# Patient Record
Sex: Male | Born: 1935 | Race: White | Hispanic: No | Marital: Married
Health system: Southern US, Community
[De-identification: ages and names within clinical notes are randomized; demographics above are authoritative.]

---

## 2012-12-09 ENCOUNTER — Ambulatory Visit: Payer: Self-pay | Admitting: Oncology

## 2012-12-09 LAB — COMPREHENSIVE METABOLIC PANEL
Albumin: 3.7 g/dL (ref 3.4–5.0)
BUN: 31 mg/dL — ABNORMAL HIGH (ref 7–18)
Bilirubin,Total: 0.3 mg/dL (ref 0.2–1.0)
Chloride: 105 mmol/L (ref 98–107)
Co2: 30 mmol/L (ref 21–32)
EGFR (Non-African Amer.): 31 — ABNORMAL LOW
Osmolality: 286 (ref 275–301)
Potassium: 4.1 mmol/L (ref 3.5–5.1)
SGOT(AST): 30 U/L (ref 15–37)
SGPT (ALT): 23 U/L (ref 12–78)
Sodium: 139 mmol/L (ref 136–145)
Total Protein: 7 g/dL (ref 6.4–8.2)

## 2012-12-09 LAB — CBC CANCER CENTER
Eosinophil %: 1.1 %
HCT: 35.1 % — ABNORMAL LOW (ref 40.0–52.0)
Lymphocyte #: 1 x10 3/mm (ref 1.0–3.6)
Lymphocyte %: 14.9 %
MCV: 93 fL (ref 80–100)
Monocyte %: 7.7 %
Neutrophil #: 5.3 x10 3/mm (ref 1.4–6.5)
Neutrophil %: 75.6 %
Platelet: 144 x10 3/mm — ABNORMAL LOW (ref 150–440)
RBC: 3.78 10*6/uL — ABNORMAL LOW (ref 4.40–5.90)
RDW: 13.2 % (ref 11.5–14.5)
WBC: 7 x10 3/mm (ref 3.8–10.6)

## 2012-12-24 ENCOUNTER — Ambulatory Visit: Payer: Self-pay | Admitting: Oncology

## 2013-01-05 ENCOUNTER — Ambulatory Visit: Payer: Self-pay | Admitting: Internal Medicine

## 2013-01-20 LAB — BASIC METABOLIC PANEL
Anion Gap: 8 (ref 7–16)
BUN: 25 mg/dL — ABNORMAL HIGH (ref 7–18)
Calcium, Total: 9 mg/dL (ref 8.5–10.1)
Co2: 31 mmol/L (ref 21–32)
Creatinine: 1.65 mg/dL — ABNORMAL HIGH (ref 0.60–1.30)
EGFR (African American): 44 — ABNORMAL LOW
EGFR (Non-African Amer.): 38 — ABNORMAL LOW
Glucose: 156 mg/dL — ABNORMAL HIGH (ref 65–99)
Potassium: 4 mmol/L (ref 3.5–5.1)
Sodium: 140 mmol/L (ref 136–145)

## 2013-01-21 LAB — PSA: PSA: 8.3 ng/mL

## 2013-01-24 ENCOUNTER — Ambulatory Visit: Payer: Self-pay | Admitting: Oncology

## 2013-03-24 ENCOUNTER — Ambulatory Visit: Payer: Self-pay | Admitting: Oncology

## 2013-03-31 ENCOUNTER — Ambulatory Visit: Payer: Self-pay | Admitting: Urology

## 2013-04-09 ENCOUNTER — Emergency Department: Payer: Self-pay | Admitting: Emergency Medicine

## 2013-04-09 LAB — BASIC METABOLIC PANEL
Anion Gap: 6 — ABNORMAL LOW (ref 7–16)
BUN: 28 mg/dL — ABNORMAL HIGH (ref 7–18)
Calcium, Total: 8.6 mg/dL (ref 8.5–10.1)
Co2: 29 mmol/L (ref 21–32)
Co2: 28 mmol/L (ref 21–32)
Creatinine: 1.66 mg/dL — ABNORMAL HIGH (ref 0.60–1.30)
Creatinine: 1.64 mg/dL — ABNORMAL HIGH (ref 0.60–1.30)
EGFR (African American): 43 — ABNORMAL LOW
EGFR (African American): 44 — ABNORMAL LOW
EGFR (Non-African Amer.): 37 — ABNORMAL LOW
Glucose: 121 mg/dL — ABNORMAL HIGH (ref 65–99)
Glucose: 150 mg/dL — ABNORMAL HIGH (ref 65–99)
Osmolality: 284 (ref 275–301)
Osmolality: 283 (ref 275–301)
Potassium: 4.1 mmol/L (ref 3.5–5.1)
Sodium: 139 mmol/L (ref 136–145)
Sodium: 137 mmol/L (ref 136–145)

## 2013-04-09 LAB — CBC
HCT: 36.4 % — ABNORMAL LOW (ref 40.0–52.0)
HCT: 34.4 % — ABNORMAL LOW (ref 40.0–52.0)
HGB: 12.7 g/dL — ABNORMAL LOW (ref 13.0–18.0)
HGB: 11.7 g/dL — ABNORMAL LOW (ref 13.0–18.0)
MCH: 31.8 pg (ref 26.0–34.0)
MCH: 31 pg (ref 26.0–34.0)
MCHC: 34.8 g/dL (ref 32.0–36.0)
MCV: 91 fL (ref 80–100)
MCV: 91 fL (ref 80–100)
Platelet: 152 10*3/uL (ref 150–440)
RDW: 13.3 % (ref 11.5–14.5)
WBC: 11.3 10*3/uL — ABNORMAL HIGH (ref 3.8–10.6)

## 2013-04-09 LAB — PROTIME-INR
INR: 1.1
Prothrombin Time: 14.1 secs (ref 11.5–14.7)

## 2013-04-09 LAB — APTT: Activated PTT: 29 secs (ref 23.6–35.9)

## 2013-04-10 LAB — BASIC METABOLIC PANEL
Anion Gap: 6 — ABNORMAL LOW (ref 7–16)
Calcium, Total: 7.9 mg/dL — ABNORMAL LOW (ref 8.5–10.1)
Chloride: 103 mmol/L (ref 98–107)
Co2: 26 mmol/L (ref 21–32)
Glucose: 177 mg/dL — ABNORMAL HIGH (ref 65–99)
Osmolality: 283 (ref 275–301)
Potassium: 4.8 mmol/L (ref 3.5–5.1)
Sodium: 135 mmol/L — ABNORMAL LOW (ref 136–145)

## 2013-04-10 LAB — CBC WITH DIFFERENTIAL/PLATELET
Basophil #: 0 10*3/uL (ref 0.0–0.1)
Basophil %: 0.2 %
HCT: 30.8 % — ABNORMAL LOW (ref 40.0–52.0)
Lymphocyte %: 5.9 %
MCH: 31.4 pg (ref 26.0–34.0)
MCHC: 34 g/dL (ref 32.0–36.0)
Monocyte #: 1.3 x10 3/mm — ABNORMAL HIGH (ref 0.2–1.0)
Monocyte %: 9.3 %
Neutrophil #: 11.8 10*3/uL — ABNORMAL HIGH (ref 1.4–6.5)
Neutrophil %: 84.4 %
Platelet: 132 10*3/uL — ABNORMAL LOW (ref 150–440)
RBC: 3.34 10*6/uL — ABNORMAL LOW (ref 4.40–5.90)
WBC: 14 10*3/uL — ABNORMAL HIGH (ref 3.8–10.6)

## 2013-04-11 ENCOUNTER — Ambulatory Visit: Payer: Self-pay | Admitting: Urology

## 2013-04-11 ENCOUNTER — Inpatient Hospital Stay: Payer: Self-pay | Admitting: Urology

## 2013-04-11 LAB — CBC WITH DIFFERENTIAL/PLATELET
Basophil %: 0.3 %
Eosinophil #: 0 10*3/uL (ref 0.0–0.7)
Eosinophil #: 0 10*3/uL (ref 0.0–0.7)
Eosinophil %: 0 %
HCT: 22 % — ABNORMAL LOW (ref 40.0–52.0)
HCT: 27.3 % — ABNORMAL LOW (ref 40.0–52.0)
HGB: 9.5 g/dL — ABNORMAL LOW (ref 13.0–18.0)
MCH: 32 pg (ref 26.0–34.0)
MCHC: 34.5 g/dL (ref 32.0–36.0)
Monocyte #: 0.5 x10 3/mm (ref 0.2–1.0)
Monocyte #: 1 x10 3/mm (ref 0.2–1.0)
Monocyte %: 4.3 %
Monocyte %: 6.9 %
Neutrophil #: 12.7 10*3/uL — ABNORMAL HIGH (ref 1.4–6.5)
Platelet: 98 10*3/uL — ABNORMAL LOW (ref 150–440)
Platelet: 84 10*3/uL — ABNORMAL LOW (ref 150–440)
RBC: 2.37 10*6/uL — ABNORMAL LOW (ref 4.40–5.90)
RBC: 2.99 10*6/uL — ABNORMAL LOW (ref 4.40–5.90)
RDW: 13.5 % (ref 11.5–14.5)
WBC: 12.7 10*3/uL — ABNORMAL HIGH (ref 3.8–10.6)
WBC: 14.3 10*3/uL — ABNORMAL HIGH (ref 3.8–10.6)

## 2013-04-11 LAB — COMPREHENSIVE METABOLIC PANEL
Albumin: 2.5 g/dL — ABNORMAL LOW (ref 3.4–5.0)
Alkaline Phosphatase: 48 U/L — ABNORMAL LOW (ref 50–136)
Anion Gap: 7 (ref 7–16)
Bilirubin,Total: 0.7 mg/dL (ref 0.2–1.0)
Creatinine: 2.99 mg/dL — ABNORMAL HIGH (ref 0.60–1.30)
Glucose: 180 mg/dL — ABNORMAL HIGH (ref 65–99)
Osmolality: 278 (ref 275–301)
SGPT (ALT): 14 U/L (ref 12–78)
Total Protein: 5.2 g/dL — ABNORMAL LOW (ref 6.4–8.2)

## 2013-04-11 LAB — MAGNESIUM: Magnesium: 2.2 mg/dL

## 2013-04-11 LAB — CK TOTAL AND CKMB (NOT AT ARMC)
CK, Total: 221 U/L (ref 35–232)
CK, Total: 237 U/L — ABNORMAL HIGH (ref 35–232)
CK-MB: 3 ng/mL (ref 0.5–3.6)
CK-MB: 3 ng/mL (ref 0.5–3.6)

## 2013-04-11 LAB — PROTIME-INR
INR: 1.2
Prothrombin Time: 15.6 secs — ABNORMAL HIGH (ref 11.5–14.7)

## 2013-04-11 LAB — TROPONIN I: Troponin-I: <0.02 ng/mL

## 2013-04-11 LAB — PHOSPHORUS: Phosphorus: 4.4 mg/dL (ref 2.5–4.9)

## 2013-04-12 LAB — CBC WITH DIFFERENTIAL/PLATELET
Basophil #: 0 10*3/uL (ref 0.0–0.1)
Basophil #: 0 10*3/uL (ref 0.0–0.1)
Basophil %: 0.3 %
Eosinophil #: 0 10*3/uL (ref 0.0–0.7)
Eosinophil #: 0 10*3/uL (ref 0.0–0.7)
Eosinophil %: 0.1 %
Eosinophil %: 0.2 %
HCT: 23 % — ABNORMAL LOW (ref 40.0–52.0)
HCT: 24.7 % — ABNORMAL LOW (ref 40.0–52.0)
Lymphocyte #: 0.5 10*3/uL — ABNORMAL LOW (ref 1.0–3.6)
Lymphocyte %: 4.8 %
Lymphocyte %: 4.3 %
MCH: 32.5 pg (ref 26.0–34.0)
MCHC: 34.1 g/dL (ref 32.0–36.0)
MCV: 91 fL (ref 80–100)
Monocyte #: 0.9 x10 3/mm (ref 0.2–1.0)
Monocyte %: 7.7 %
Neutrophil #: 11.3 10*3/uL — ABNORMAL HIGH (ref 1.4–6.5)
Neutrophil #: 10.6 10*3/uL — ABNORMAL HIGH (ref 1.4–6.5)
Platelet: 80 10*3/uL — ABNORMAL LOW (ref 150–440)
Platelet: 79 10*3/uL — ABNORMAL LOW (ref 150–440)
RBC: 2.81 10*6/uL — ABNORMAL LOW (ref 4.40–5.90)
RDW: 13.8 % (ref 11.5–14.5)
RDW: 16.5 % — ABNORMAL HIGH (ref 11.5–14.5)
WBC: 12.8 10*3/uL — ABNORMAL HIGH (ref 3.8–10.6)

## 2013-04-12 LAB — BASIC METABOLIC PANEL
Anion Gap: 8 (ref 7–16)
Anion Gap: 8 (ref 7–16)
BUN: 59 mg/dL — ABNORMAL HIGH (ref 7–18)
Calcium, Total: 6.8 mg/dL — CL (ref 8.5–10.1)
Chloride: 98 mmol/L (ref 98–107)
Chloride: 99 mmol/L (ref 98–107)
Co2: 21 mmol/L (ref 21–32)
Creatinine: 3.4 mg/dL — ABNORMAL HIGH (ref 0.60–1.30)
EGFR (African American): 18 — ABNORMAL LOW
EGFR (Non-African Amer.): 16 — ABNORMAL LOW
Glucose: 138 mg/dL — ABNORMAL HIGH (ref 65–99)
Glucose: 142 mg/dL — ABNORMAL HIGH (ref 65–99)
Osmolality: 276 (ref 275–301)
Potassium: 5 mmol/L (ref 3.5–5.1)
Sodium: 128 mmol/L — ABNORMAL LOW (ref 136–145)
Sodium: 128 mmol/L — ABNORMAL LOW (ref 136–145)

## 2013-04-13 LAB — CBC WITH DIFFERENTIAL/PLATELET
Basophil #: 0 10*3/uL (ref 0.0–0.1)
Basophil %: 0.2 %
Eosinophil #: 0.1 10*3/uL (ref 0.0–0.7)
HGB: 7.9 g/dL — ABNORMAL LOW (ref 13.0–18.0)
Lymphocyte #: 0.6 10*3/uL — ABNORMAL LOW (ref 1.0–3.6)
Lymphocyte %: 6 %
MCH: 30.8 pg (ref 26.0–34.0)
MCHC: 35 g/dL (ref 32.0–36.0)
MCV: 88 fL (ref 80–100)
Monocyte #: 0.8 x10 3/mm (ref 0.2–1.0)
WBC: 9.8 10*3/uL (ref 3.8–10.6)

## 2013-04-13 LAB — BASIC METABOLIC PANEL
Anion Gap: 8 (ref 7–16)
BUN: 60 mg/dL — ABNORMAL HIGH (ref 7–18)
Calcium, Total: 7.1 mg/dL — ABNORMAL LOW (ref 8.5–10.1)
Chloride: 100 mmol/L (ref 98–107)
Creatinine: 2.68 mg/dL — ABNORMAL HIGH (ref 0.60–1.30)
EGFR (Non-African Amer.): 21 — ABNORMAL LOW
Glucose: 121 mg/dL — ABNORMAL HIGH (ref 65–99)
Osmolality: 279 (ref 275–301)
Potassium: 4.6 mmol/L (ref 3.5–5.1)

## 2013-04-13 LAB — FIBRINOGEN: Fibrinogen: 475 mg/dL — ABNORMAL HIGH (ref 210–470)

## 2013-04-14 LAB — CBC WITH DIFFERENTIAL/PLATELET
Basophil %: 0.3 %
Eosinophil #: 0.2 10*3/uL (ref 0.0–0.7)
Eosinophil %: 2.4 %
HCT: 20.5 % — ABNORMAL LOW (ref 40.0–52.0)
HGB: 7.2 g/dL — ABNORMAL LOW (ref 13.0–18.0)
Lymphocyte #: 0.6 10*3/uL — ABNORMAL LOW (ref 1.0–3.6)
MCH: 30.9 pg (ref 26.0–34.0)
MCHC: 35 g/dL (ref 32.0–36.0)
MCV: 88 fL (ref 80–100)
Monocyte #: 0.6 x10 3/mm (ref 0.2–1.0)
Monocyte %: 8 %
Neutrophil #: 6.2 10*3/uL (ref 1.4–6.5)
RDW: 16.4 % — ABNORMAL HIGH (ref 11.5–14.5)

## 2013-04-14 LAB — PROTIME-INR
INR: 1.2
Prothrombin Time: 15.7 secs — ABNORMAL HIGH (ref 11.5–14.7)

## 2013-04-14 LAB — BASIC METABOLIC PANEL
Anion Gap: 6 — ABNORMAL LOW (ref 7–16)
BUN: 41 mg/dL — ABNORMAL HIGH (ref 7–18)
Calcium, Total: 6.9 mg/dL — CL (ref 8.5–10.1)
Chloride: 103 mmol/L (ref 98–107)
Co2: 26 mmol/L (ref 21–32)
Creatinine: 1.72 mg/dL — ABNORMAL HIGH (ref 0.60–1.30)
EGFR (African American): 41 — ABNORMAL LOW
Glucose: 97 mg/dL (ref 65–99)
Potassium: 3.6 mmol/L (ref 3.5–5.1)
Sodium: 135 mmol/L — ABNORMAL LOW (ref 136–145)

## 2013-04-15 LAB — CBC WITH DIFFERENTIAL/PLATELET
Basophil #: 0 10*3/uL (ref 0.0–0.1)
Basophil %: 0.3 %
Eosinophil #: 0.2 10*3/uL (ref 0.0–0.7)
HCT: 24.6 % — ABNORMAL LOW (ref 40.0–52.0)
HGB: 8.7 g/dL — ABNORMAL LOW (ref 13.0–18.0)
Lymphocyte %: 8.9 %
MCH: 30.9 pg (ref 26.0–34.0)
MCHC: 35.3 g/dL (ref 32.0–36.0)
MCV: 88 fL (ref 80–100)
Neutrophil #: 6.8 10*3/uL — ABNORMAL HIGH (ref 1.4–6.5)
Platelet: 100 10*3/uL — ABNORMAL LOW (ref 150–440)
RBC: 2.8 10*6/uL — ABNORMAL LOW (ref 4.40–5.90)
RDW: 16.2 % — ABNORMAL HIGH (ref 11.5–14.5)
WBC: 8.5 10*3/uL (ref 3.8–10.6)

## 2013-04-15 LAB — URINALYSIS, COMPLETE
RBC,UR: 783 /HPF (ref 0–5)
Specific Gravity: 1.016 (ref 1.003–1.030)
WBC UR: 101 /HPF (ref 0–5)

## 2013-04-15 LAB — OCCULT BLOOD X 1 CARD TO LAB, STOOL: Occult Blood, Feces: POSITIVE

## 2013-04-15 LAB — GASTROCCULT (ARMC)
Occult Blood, Gastric: POSITIVE
Ph, Gastric: 1 (ref 1–3)

## 2013-04-16 LAB — BASIC METABOLIC PANEL
BUN: 29 mg/dL — ABNORMAL HIGH (ref 7–18)
Calcium, Total: 6.9 mg/dL — CL (ref 8.5–10.1)
Chloride: 104 mmol/L (ref 98–107)
EGFR (African American): 43 — ABNORMAL LOW
EGFR (Non-African Amer.): 37 — ABNORMAL LOW
Glucose: 122 mg/dL — ABNORMAL HIGH (ref 65–99)
Sodium: 137 mmol/L (ref 136–145)

## 2013-04-16 LAB — CBC WITH DIFFERENTIAL/PLATELET
Basophil #: 0 10*3/uL (ref 0.0–0.1)
Basophil %: 0.4 %
Eosinophil #: 0.2 10*3/uL (ref 0.0–0.7)
Eosinophil %: 1.6 %
HGB: 8.8 g/dL — ABNORMAL LOW (ref 13.0–18.0)
Lymphocyte #: 0.6 10*3/uL — ABNORMAL LOW (ref 1.0–3.6)
MCH: 30.5 pg (ref 26.0–34.0)
MCV: 88 fL (ref 80–100)
Neutrophil #: 9.6 10*3/uL — ABNORMAL HIGH (ref 1.4–6.5)
Platelet: 123 10*3/uL — ABNORMAL LOW (ref 150–440)
RBC: 2.89 10*6/uL — ABNORMAL LOW (ref 4.40–5.90)
RDW: 15.8 % — ABNORMAL HIGH (ref 11.5–14.5)
WBC: 11.3 10*3/uL — ABNORMAL HIGH (ref 3.8–10.6)

## 2013-04-17 ENCOUNTER — Ambulatory Visit: Payer: Self-pay | Admitting: Oncology

## 2013-04-17 LAB — CBC WITH DIFFERENTIAL/PLATELET
Basophil #: 0 10*3/uL (ref 0.0–0.1)
HCT: 22.7 % — ABNORMAL LOW (ref 40.0–52.0)
HGB: 7.7 g/dL — ABNORMAL LOW (ref 13.0–18.0)
Lymphocyte #: 0.8 10*3/uL — ABNORMAL LOW (ref 1.0–3.6)
Lymphocyte %: 11.3 %
MCH: 29.9 pg (ref 26.0–34.0)
MCV: 88 fL (ref 80–100)
Monocyte #: 0.5 x10 3/mm (ref 0.2–1.0)
Monocyte %: 7.2 %
Neutrophil #: 5.5 10*3/uL (ref 1.4–6.5)
Neutrophil %: 77.3 %
RBC: 2.57 10*6/uL — ABNORMAL LOW (ref 4.40–5.90)
RDW: 15.3 % — ABNORMAL HIGH (ref 11.5–14.5)
WBC: 7.1 10*3/uL (ref 3.8–10.6)

## 2013-04-17 LAB — BASIC METABOLIC PANEL
Anion Gap: 4 — ABNORMAL LOW (ref 7–16)
BUN: 25 mg/dL — ABNORMAL HIGH (ref 7–18)
Calcium, Total: 6.7 mg/dL — CL (ref 8.5–10.1)
Chloride: 109 mmol/L — ABNORMAL HIGH (ref 98–107)
Co2: 28 mmol/L (ref 21–32)
Creatinine: 1.47 mg/dL — ABNORMAL HIGH (ref 0.60–1.30)
EGFR (Non-African Amer.): 43 — ABNORMAL LOW
Glucose: 94 mg/dL (ref 65–99)
Osmolality: 285 (ref 275–301)
Potassium: 3.5 mmol/L (ref 3.5–5.1)

## 2013-04-17 LAB — MAGNESIUM: Magnesium: 1.7 mg/dL — ABNORMAL LOW

## 2013-04-19 LAB — CULTURE, BLOOD (SINGLE)

## 2013-04-23 ENCOUNTER — Ambulatory Visit: Payer: Self-pay | Admitting: Oncology

## 2013-05-04 ENCOUNTER — Ambulatory Visit: Payer: Self-pay | Admitting: Oncology

## 2013-05-05 LAB — BASIC METABOLIC PANEL
BUN: 17 mg/dL (ref 7–18)
Calcium, Total: 9 mg/dL (ref 8.5–10.1)
Co2: 27 mmol/L (ref 21–32)
EGFR (African American): 49 — ABNORMAL LOW
EGFR (Non-African Amer.): 42 — ABNORMAL LOW
Glucose: 124 mg/dL — ABNORMAL HIGH (ref 65–99)
Osmolality: 286 (ref 275–301)
Potassium: 4.4 mmol/L (ref 3.5–5.1)
Sodium: 142 mmol/L (ref 136–145)

## 2013-05-05 LAB — CBC CANCER CENTER
Basophil #: 0.1 x10 3/mm (ref 0.0–0.1)
Eosinophil #: 0.1 x10 3/mm (ref 0.0–0.7)
HCT: 28.4 % — ABNORMAL LOW (ref 40.0–52.0)
Lymphocyte #: 0.9 x10 3/mm — ABNORMAL LOW (ref 1.0–3.6)
MCH: 29.2 pg (ref 26.0–34.0)
MCHC: 33.1 g/dL (ref 32.0–36.0)
MCV: 88 fL (ref 80–100)
Monocyte #: 0.4 x10 3/mm (ref 0.2–1.0)
Neutrophil #: 6.8 x10 3/mm — ABNORMAL HIGH (ref 1.4–6.5)
Neutrophil %: 81.5 %
Platelet: 192 x10 3/mm (ref 150–440)
RDW: 15.1 % — ABNORMAL HIGH (ref 11.5–14.5)
WBC: 8.4 x10 3/mm (ref 3.8–10.6)

## 2013-05-06 LAB — PSA: PSA: 9.9 ng/mL — ABNORMAL HIGH (ref 0.0–4.0)

## 2013-05-24 ENCOUNTER — Ambulatory Visit: Payer: Self-pay | Admitting: Oncology

## 2013-06-05 LAB — BASIC METABOLIC PANEL
Anion Gap: 6 — ABNORMAL LOW (ref 7–16)
BUN: 24 mg/dL — ABNORMAL HIGH (ref 7–18)
Co2: 32 mmol/L (ref 21–32)
EGFR (African American): 37 — ABNORMAL LOW
Potassium: 4.1 mmol/L (ref 3.5–5.1)

## 2013-06-06 LAB — PSA: PSA: 5.2 ng/mL — ABNORMAL HIGH (ref 0.0–4.0)

## 2013-06-23 ENCOUNTER — Ambulatory Visit: Payer: Self-pay | Admitting: Oncology

## 2013-07-03 LAB — BASIC METABOLIC PANEL
Anion Gap: 7 (ref 7–16)
Calcium, Total: 8.2 mg/dL — ABNORMAL LOW (ref 8.5–10.1)
Chloride: 107 mmol/L (ref 98–107)
Creatinine: 1.89 mg/dL — ABNORMAL HIGH (ref 0.60–1.30)
EGFR (Non-African Amer.): 32 — ABNORMAL LOW
Glucose: 138 mg/dL — ABNORMAL HIGH (ref 65–99)
Osmolality: 295 (ref 275–301)

## 2013-07-24 ENCOUNTER — Ambulatory Visit: Payer: Self-pay | Admitting: Oncology

## 2013-07-31 LAB — BASIC METABOLIC PANEL
Calcium, Total: 8.5 mg/dL (ref 8.5–10.1)
Chloride: 105 mmol/L (ref 98–107)
Co2: 31 mmol/L (ref 21–32)
EGFR (Non-African Amer.): 27 — ABNORMAL LOW
Glucose: 156 mg/dL — ABNORMAL HIGH (ref 65–99)
Osmolality: 294 (ref 275–301)
Potassium: 4.3 mmol/L (ref 3.5–5.1)
Sodium: 142 mmol/L (ref 136–145)

## 2013-08-24 ENCOUNTER — Ambulatory Visit: Payer: Self-pay | Admitting: Oncology

## 2013-08-28 ENCOUNTER — Ambulatory Visit: Payer: Self-pay | Admitting: Oncology

## 2013-08-28 LAB — BASIC METABOLIC PANEL
Anion Gap: 6 — ABNORMAL LOW (ref 7–16)
BUN: 30 mg/dL — ABNORMAL HIGH (ref 7–18)
Calcium, Total: 8.9 mg/dL (ref 8.5–10.1)
Chloride: 107 mmol/L (ref 98–107)
Co2: 31 mmol/L (ref 21–32)
Creatinine: 1.91 mg/dL — ABNORMAL HIGH (ref 0.60–1.30)
EGFR (Non-African Amer.): 31 — ABNORMAL LOW
Glucose: 134 mg/dL — ABNORMAL HIGH (ref 65–99)
Potassium: 3.6 mmol/L (ref 3.5–5.1)
Sodium: 144 mmol/L (ref 136–145)

## 2013-09-18 ENCOUNTER — Ambulatory Visit: Payer: Self-pay | Admitting: Urology

## 2013-09-18 LAB — PROTIME-INR: INR: 1

## 2013-09-18 LAB — APTT: Activated PTT: 30.3 secs (ref 23.6–35.9)

## 2013-09-23 ENCOUNTER — Ambulatory Visit: Payer: Self-pay | Admitting: Oncology

## 2013-10-12 ENCOUNTER — Ambulatory Visit: Payer: Self-pay | Admitting: Urology

## 2013-10-12 DIAGNOSIS — I10 Essential (primary) hypertension: Secondary | ICD-10-CM

## 2013-10-12 LAB — CBC WITH DIFFERENTIAL/PLATELET
Basophil #: 0.1 10*3/uL (ref 0.0–0.1)
Basophil %: 0.7 %
Eosinophil %: 2.3 %
Lymphocyte #: 0.6 10*3/uL — ABNORMAL LOW (ref 1.0–3.6)
Monocyte #: 0.4 x10 3/mm (ref 0.2–1.0)
Monocyte %: 4.9 %
Neutrophil #: 7.2 10*3/uL — ABNORMAL HIGH (ref 1.4–6.5)
Neutrophil %: 85.3 %
Platelet: 170 10*3/uL (ref 150–440)
RBC: 3.24 10*6/uL — ABNORMAL LOW (ref 4.40–5.90)
RDW: 14.3 % (ref 11.5–14.5)

## 2013-10-12 LAB — BASIC METABOLIC PANEL
Anion Gap: 3 — ABNORMAL LOW (ref 7–16)
Calcium, Total: 8.7 mg/dL (ref 8.5–10.1)
Chloride: 107 mmol/L (ref 98–107)
Co2: 29 mmol/L (ref 21–32)
Creatinine: 1.91 mg/dL — ABNORMAL HIGH (ref 0.60–1.30)
EGFR (African American): 36 — ABNORMAL LOW
Potassium: 4.6 mmol/L (ref 3.5–5.1)

## 2013-10-19 ENCOUNTER — Ambulatory Visit: Payer: Self-pay | Admitting: Urology

## 2013-10-19 LAB — PLATELET COUNT: Platelet: 179 10*3/uL (ref 150–440)

## 2013-10-19 LAB — PROTIME-INR
INR: 1
Prothrombin Time: 13 secs (ref 11.5–14.7)

## 2013-10-19 LAB — APTT: Activated PTT: 29 secs (ref 23.6–35.9)

## 2013-11-10 ENCOUNTER — Ambulatory Visit: Payer: Self-pay | Admitting: Urology

## 2014-03-24 IMAGING — XA IR URETERAL STENT INTRO ANTEGRADE
8 series · 8 of 8 positions shown · non-contrast
Comparison: none

REASON FOR EXAM: Left Hydronephrosis
COMMENTS:

[Series 1: single · 1 of 1 slices shown (1 of 8)]
[im 1/1]
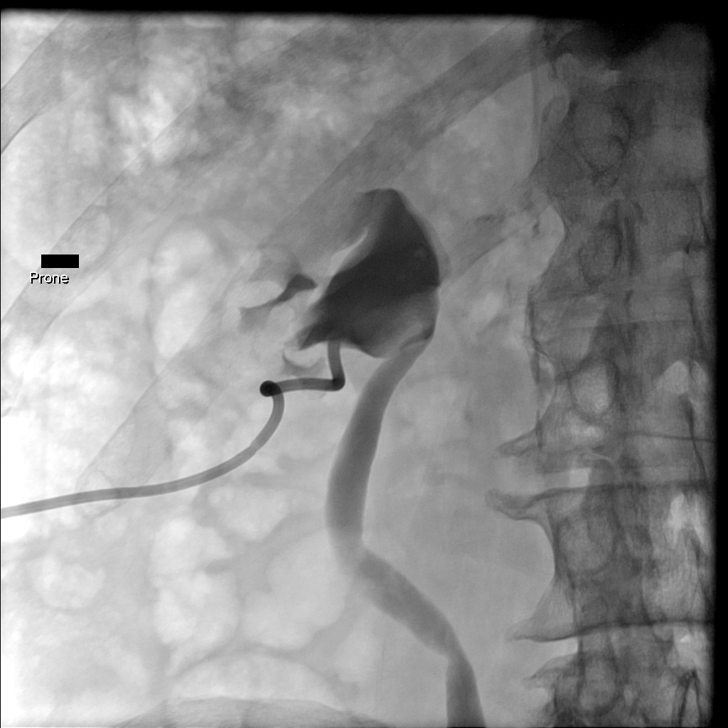

[Series 2: single · 1 of 1 slices shown (2 of 8)]
[im 1/1]
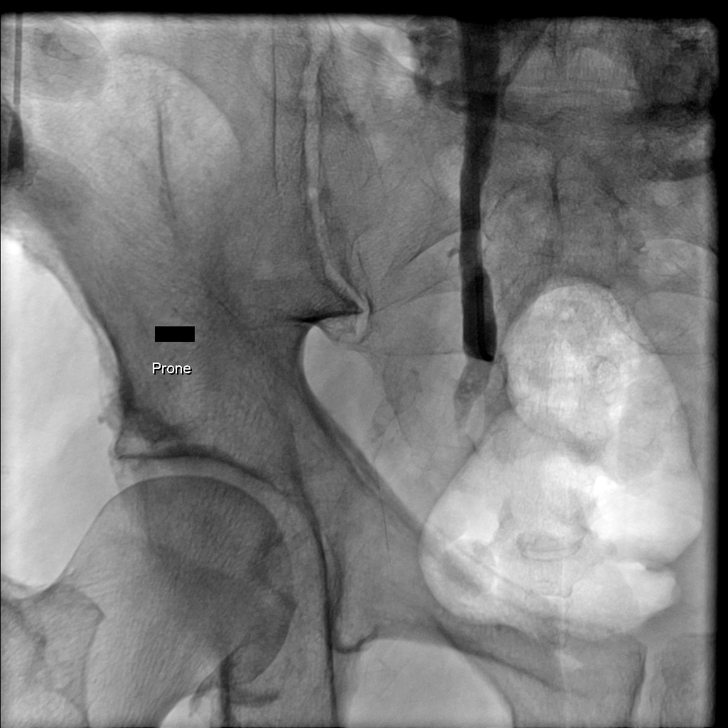

[Series 3: single · 1 of 1 slices shown (3 of 8)]
[im 1/1]
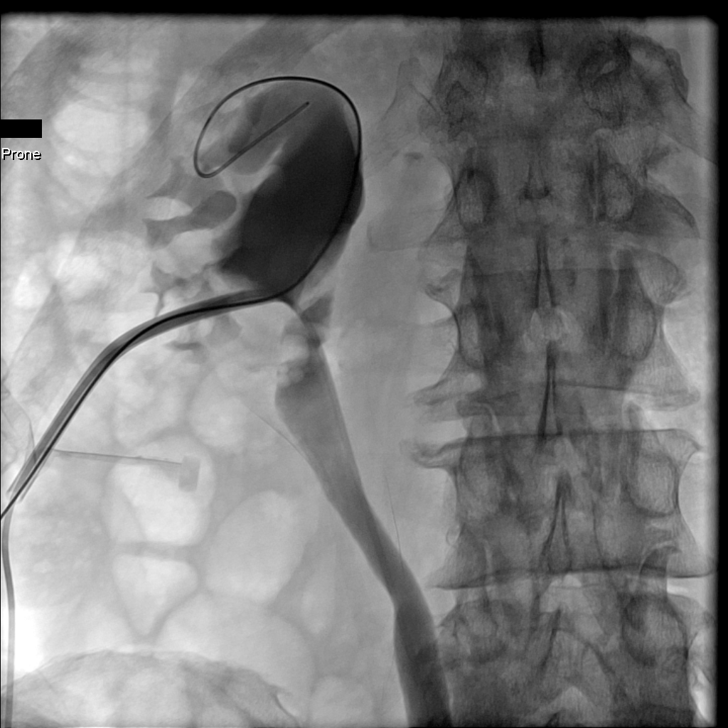

[Series 4: single · 1 of 1 slices shown (4 of 8)]
[im 1/1]
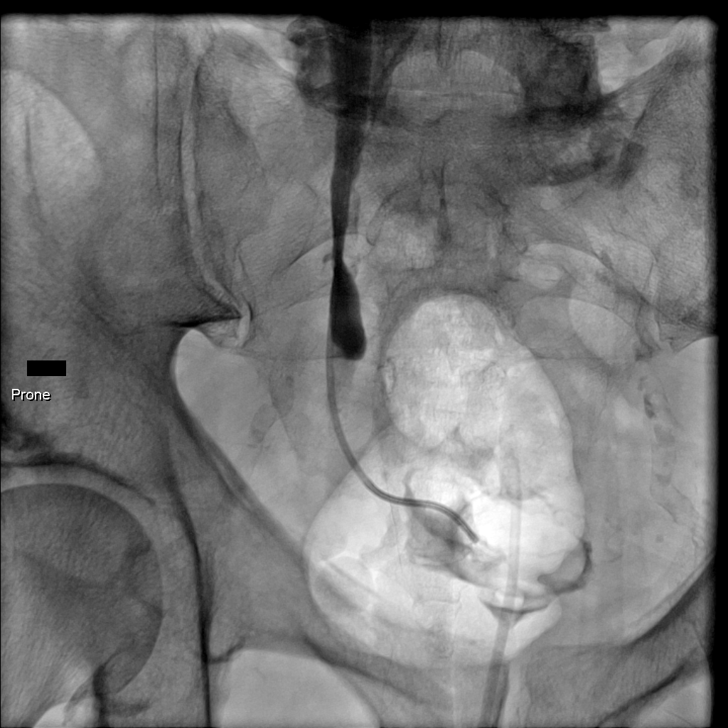

[Series 5: single · 1 of 1 slices shown (5 of 8)]
[im 1/1]
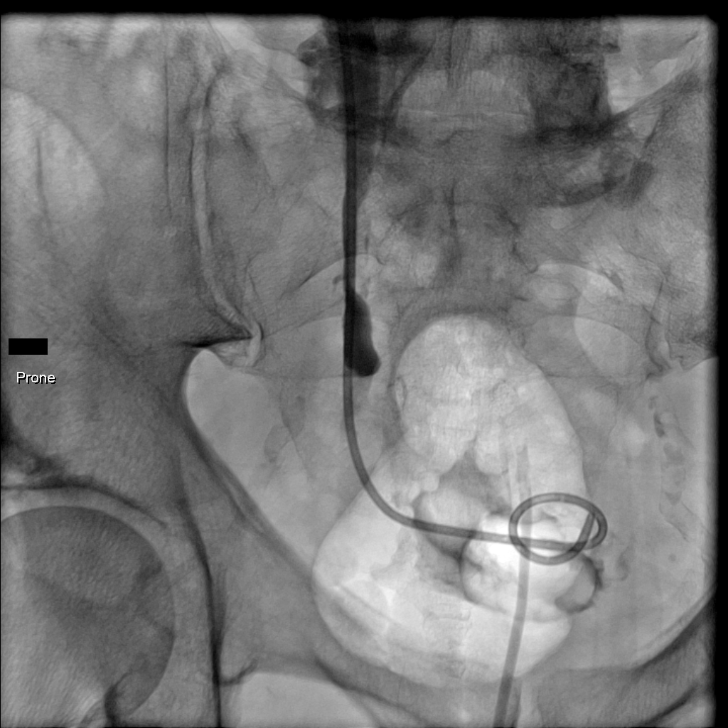

[Series 6: single · 1 of 1 slices shown (6 of 8)]
[im 1/1]
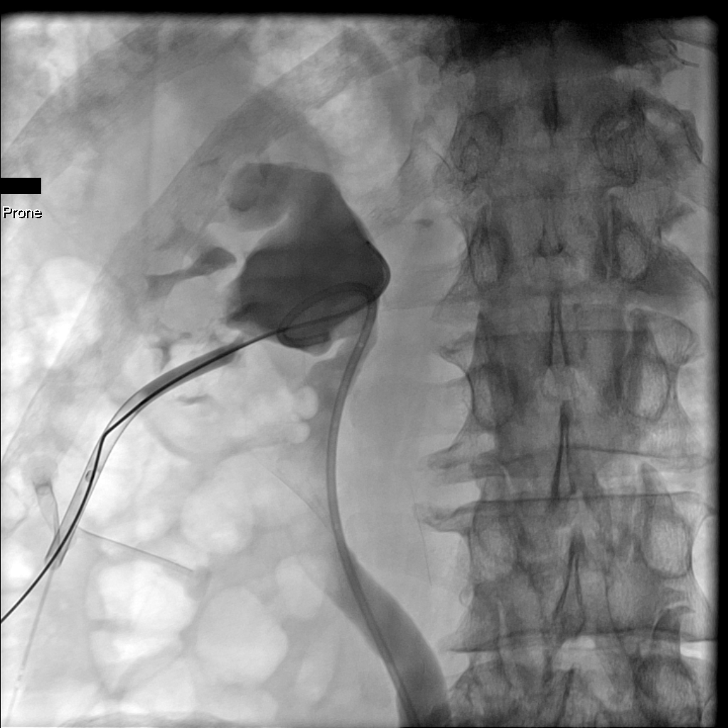

[Series 7: single · 1 of 1 slices shown (7 of 8)]
[im 1/1]
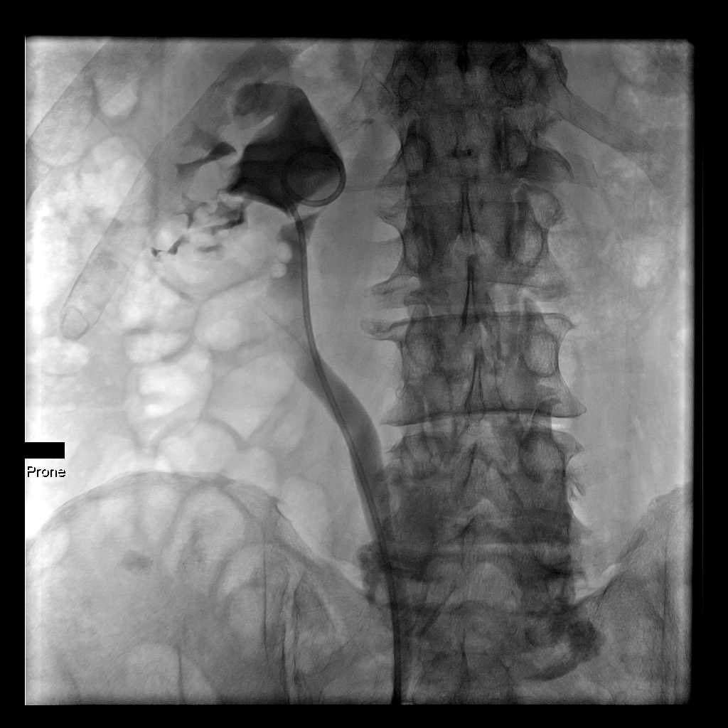

[Series 8: single · 1 of 1 slices shown (8 of 8)]
[im 1/1]
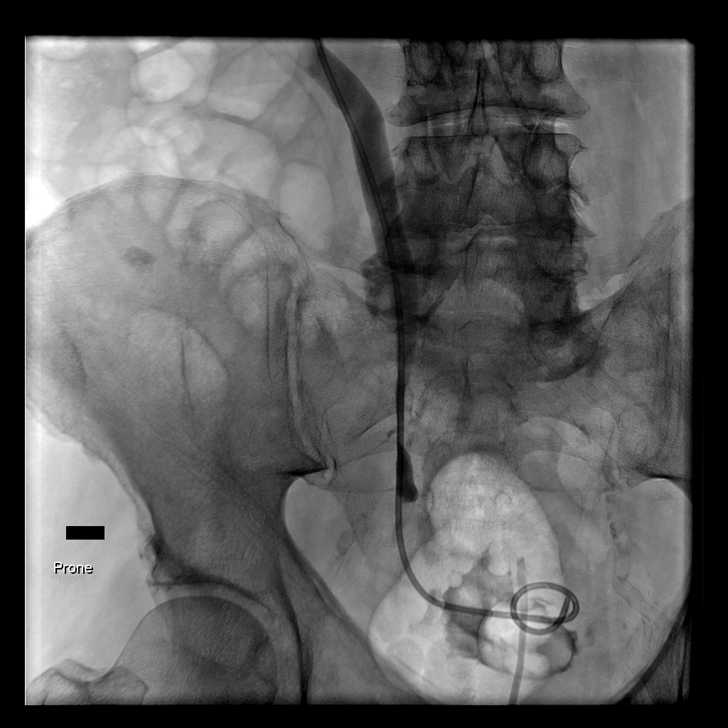

[8 of 8 positions shown; findings below may reference images not displayed]

PROCEDURE:     VAS - STENT PLACE LEFT URETER  - October 19, 2013  [DATE]

RESULT:     History: Hydronephrosis.

Procedure and Findings: After discussing the risk and benefits of this
procedure with the patient informed consent was obtained. The back was
sterilely prepped and draped. IV conscious sedation was performed as
documented. Local anesthesia was administered with 1% lidocaine. Left
nephrostogram was performed. Nephrostomy tube was removed over an Amplatz
extra-stiff wire and a 10 French peel-away sheath placed. This was followed
by placement of a Kumpe catheter and Glidewire. Glidewire and catheter were
advanced down the left ureter across the distal left ureteral obstruction
and into the bladder. Guidewire exchange for Lunderquist exchange wire was
performed. This followed placement of a 6 French 24 cm double-J ureteral
stent. No complications.
IMPRESSION: Successful left double-J stent placement placement.

## 2014-06-23 DEATH — deceased

## 2015-04-15 NOTE — Discharge Summary (Signed)
PATIENT NAME:  John Espinoza, John Espinoza MR#:  [id number] DATE OF BIRTH:  28-May-1926  DATE OF ADMISSION:  04/11/2013 DATE OF DISCHARGE:  04/17/2013   PRINCIPAL DIAGNOSES: Metastatic prostate cancer. Bladder hemorrhage with clot retention. Chronic left hydronephrosis. New-onset right hydronephrosis secondary to clot retention. Acute on chronic renal failure. Transient ischemic attack. Gastrointestinal bleed.   PROCEDURES: None.   INDICATIONS: The patient is an 81 year old gentleman with a long history of metastatic prostate cancer. He has known chronic left-sided hydronephrosis related to tumor ingrowth into the base of the bladder. He has had episodes of bleeding ongoing for several months. He has had a previous episode of bleed in the past. The ureteral orifices are not identifiable in the bladder based on previous cystoscopy due to tumor ingrowth into the base of the bladder. He presented to the Emergency Room on the 17th with acute bleeding. He returned to the Emergency Room on the 19th with significant bleeding and clot retention, requiring admission for continuous bladder irrigation. He was admitted for this purpose.   HOSPITAL COURSE: The patient was admitted on 04/11/2013 for clot retention related to bladder hemorrhage from metastatic prostate cancer with bladder ingrowth. He was placed on continuous bladder irrigation with a 22-French Foley catheter. The bleeding persisted due to his use of aspirin prior to presentation. The bleeding was able to be initially controlled and irrigated from the catheter; however, he did develop numerous episodes of recurrent clot retention despite the continuous bladder irrigation. Due to concerns over hydronephrosis and the location of his ureteral orifice, the decision was made not to proceed with any attempt at fulguration initially. He was given Amicar to help with blood clotting due to his previous aspirin use. The dose was limited due to his previous history of stroke and  concerns over recurrent stroke. The catheter was subsequently changed to a 24-French Couvelaire catheter on continuous bladder irrigation. This allowed better evacuation of clots. This continued for several days with gradual improvement in the bleeding as the aspirin effects decreased. He developed acute on chronic renal failure with his serum creatinine reaching 3.4 on April 20. His baseline serum creatinine prior to presentation was 1.6. He had decreased to 1.47 by the time of discharge. He was noted to have new-onset right-sided hydronephrosis, in addition to the known chronic left hydronephrosis. This was a factor in the acute renal failure. This was felt to be related to his clot retention and obstruction of the right ureter related to clot. He did pass a long thin clot consistent with blood in the ureter with subsequent improvement in his renal function. An attempt at catheter removal and voiding trial was unsuccessful due to residual clot within the bladder. This was irrigated free once the catheter was replaced. An additional attempt at catheter removal and voiding trial was made on April 25. He had a bladder volume of just over 300 mL. He had no urge to urinate. A catheter was placed with only dark urine returned with no evidence of active bleeding or clot. The decision was made to remove the Foley catheter and discharge to skilled nursing facility. They have the ability to place a catheter if he is not able to void within 8 to 12 hours. His hemoglobin dropped precipitously due to the initial bleeding. His presentation hemoglobin was 12.7. He dropped to 7.6 on April 19. He received 4 units of blood transfusion with subsequent stabilization of his hemoglobin. His final hemoglobin status was 7.7 with no recurrent active bleeding. He was  found to have guaiac-positive stool and NG suction consistent with probable GI bleed, although it was felt based on GI evaluation that this was minimal in regards to his  overall blood loss. He developed abdominal distention and nausea on his initial days of hospitalization and x-ray suggested possible ileus. An NG tube was placed with subsequent decompression. This was removed after several days with advancing his diet to general, which was tolerated without significant difficulty. He underwent evaluation from a food intake and speech standpoint. He also had an episode of altered mental status that was evaluated by the hospitalist. A CT scan of the brain demonstrated no acute stroke; however, he does have a history of stroke. It was felt that he probably had a TIA. He did have some initial facial drooping and slurring, which resolved spontaneously. He was very weak due to the blood loss and multiple medical issues. He essentially stabilized and was transferred on 04/17/2013 to skilled nursing facility. A followup will be arranged in approximately 10 to 14 days for re-evaluation of the bladder. If he does have any additional bleeding, we will need to proceed to the Operating Room for further evaluation. He will need a cystoscopy on an outpatient basis for evaluation of other factors of bleeding within the bladder. He is also under the care of Cancer Center. He was seen during the hospitalization by Dr. Orlie DakinFinnegan. He had been placed on Xgeva as well as Zytiga for his metastatic prostate cancer. This was held during his hospitalization. It will likely be restarted on an outpatient basis. He is to avoid any aspirin or other nonsteroidal products due to his renal function status as well as potential risk for recurrent bleeding. He is otherwise to follow up with his primary care physician. They are to notify us if there are any further problems or questions in the interim.   ____________________________ Madolyn FriezeBrian S. Achilles Dunkope, MD bsc:jm D: 04/17/2013 15:12:00 ET T: 04/17/2013 15:55:16 ET JOB#: [id number]  cc: Madolyn FriezeBrian S. Achilles Dunkope, MD, <Dictator> Madolyn FriezeBRIAN S Shandell Giovanni MD ELECTRONICALLY SIGNED 04/21/2013  17:22

## 2015-04-15 NOTE — H&P (Signed)
PATIENT NAME:  John Espinoza, John Espinoza MR#:  [id number] DATE OF BIRTH:  Feb 27, 1926  DATE OF ADMISSION:  04/09/2013  HISTORY: The patient is an 81 year old [place] gent[place]phonzo [place]eman who recently [place] the [place], [place]. He has been seen by Dr. Lonna CobbStoioff. He has a history of prostate cancer status post radiation therapy. He was found to have bladder in-growth consistent with prostate cancer. He is currently on Zytiga and prednisone. He is being [facility].  His last PSA was around 10 in January of this year. He has been followed by Dr. Lonna CobbStoioff for left-sided hydronephrosis. His renal function status has actually improved to some degree. He had been around 1.9 for serum creatinine, and it has dropped to around 1.6. He is currently at 1.6 with new bilateral hydronephrosis. He developed recent gross hematuria and clot retention. The new right-sided hydronephrosis could be a factor with the urinary retention. There has been no effect, however, on his overall renal function status. He has had several trips to the Emergency Room due to bleeding and catheter issues. His recent CT scan demonstrates thickening of the bladder base and trigone region. His last cystoscopy in [place] was not able to identify the ureteral orifices. If stent placement is needed at some point in the future, percutaneous nephrostomy tube placement will be the most appropriate option.  Discussion has been undertaken with Mr. Cliffton AstersWhite and his family regarding this aspect. He was also noted to have some clots within the bladder. These were irrigated free in the Emergency Room. He was placed on continuous bladder irrigation. He has been utilizing aspirin on a daily basis. This is a contributing factor to his ongoing hematuria. Treatment options have been discussed with Mr. Cliffton AstersWhite and his family. He is admitted for bladder irrigation and treatment as indicated.   PAST MEDICAL HISTORY: Significant for: 1. Metastatic  prostate cancer.  2. Hydronephrosis.  3. Renal insufficiency.  4. Hypertension.   PAST SURGICAL HISTORY: Significant for prostate biopsy.   ALLERGIES: AGGRENOX.   MEDICATIONS ON ADMISSION: Zytiga 1 gram daily, Xgeva 120 mg every 3 months, vitamin D3 2000 units daily, Toviaz 4 mg daily, prednisone 5 mg twice daily, Norvasc 5 mg once daily, fish oil 1000 mg daily, Centrum Silver tablet 1 daily, calcium carbonate 1 tablet daily, ASA 325 mg daily.   SOCIAL HISTORY: Negative for significant alcohol, tobacco or drug use.   PHYSICAL EXAMINATION: VITAL SIGNS: Vital signs stable.  HEENT: Within normal limits.  CHEST: Clear to auscultation bilaterally.  CARDIOVASCULAR: Regular rate and rhythm.  ABDOMEN: Soft, nontender, nondistended except for mild tenderness in the suprapubic region.  GENITOURINARY: External genitalia within normal limits. Foley catheter in place draining blood-tinged urine on bladder irrigation.  EXTREMITIES: Free range of motion x 4.  NEUROLOGIC: Motor and sensory grossly intact.   ASSESSMENT: 1. Metastatic prostate cancer.  2. Hematuria.  3. Clot retention.  4. Bilateral hydronephrosis.  5. Chronic renal failure.   RECOMMENDATIONS: The patient will be admitted for bladder irrigation. We will stop the aspirin therapy.  We will initiate short-term treatment with Amicar to help facilitate blood coagulation. If there is a significant drop in hemoglobin or significant bleeding which cannot be managed with bladder irrigation, cystoscopy and fulguration may be indicated in the operating room. The hydronephrosis and renal function has been monitored and chronic.  The new right-sided hydronephrosis may be related to the clot retention. We will monitor the serum creatinine.  If there is a significant  elevation, percutaneous nephrostomy tube placement will then be indicated. If the bleeding can be controlled, this can be managed on an outpatient basis. We will determine this within the  next 24 hours.  ____________________________ Madolyn Frieze Achilles Dunk, MD bsc:cb D: 04/09/2013 21:03:14 ET T: 04/09/2013 21:38:51 ET JOB#: [id number] cc: Madolyn Frieze. Achilles Dunk, MD, <Dictator> Madolyn Frieze Twania Bujak MD ELECTRONICALLY SIGNED 04/13/2013 8:28

## 2015-04-15 NOTE — Consult Note (Signed)
PATIENT NAME:  John Espinoza, John Espinoza MR#:  [id number] DATE OF BIRTH:  06-12-1926  DATE OF CONSULTATION:  04/15/2013  REFERRING PHYSICIAN:  Assunta GamblesBrian Cope, MD CONSULTING PHYSICIAN:  Hardie ShackletonKaryn M. Kyzen Horn, PA-C  REASON FOR CONSULTATION: Heme-positive stools.   HISTORY OF PRESENT ILLNESS: This is a pleasant 81 year old gentleman with a past medical history significant for metastatic prostate cancer to the bladder, who presented with gross hematuria and was also found to have bilateral hydronephrosis. He has been hospitalized since April 09, 2013, and has been undergoing workup and treatment for the above conditions. His initial hemoglobin was 12.7, however, throughout the course of his admission it has steadily declined down to as low as 7.2. He has received few blood transfusions periodically over the past few days to replace this. Most recently, he had a single unit transfused yesterday and his hemoglobin was rechecked today and has climbed to 8.7. MCV is 88. The patient has not noticed any gross blood per rectum, denying any diarrhea, bright red blood per rectum or melena. A stool guaiac was obtained, however, and this did return positive. Per the patient's family, he has been chronically constipated secondary to medications that he has been on and has had to have some recent straining as well as administration of a Fleet enema. Last colonoscopy was somewhere between 5 and 10 years ago, and he does have a personal history of colon polyps. Also of note, this patient has had an NG tube placed throughout this hospitalization which began having amber-colored output that slowly developed into a darker brown color. The NG tube was removed and he initiated a clear liquid diet starting today. This has been tolerated well without any nausea, vomiting, heartburn, abdominal discomfort. No unintentional weight changes. No other symptomatic concerns.   ALLERGIES: AGGRENOX.   PAST MEDICAL HISTORY: Prostate cancer with metastasis to the  bladder, hypertension, renal insufficiency, bilateral hydronephrosis and a CVA in 1995.   PAST SURGICAL HISTORY: Prostate biopsy, cardiac stent placement in his carotids.   HOME MEDICATIONS: Carter KittenZygia, vitamin D3, Xgeva, Toviaz, prednisone, Norvasc, fish oil, Centrum Silver, calcium carbonate.   SOCIAL HISTORY: Negative for tobacco, alcohol or illicit drug use.   FAMILY HISTORY: Negative for GI malignancy or IBD.   REVIEW OF SYSTEMS: A 10-system review of systems was obtained on the patient. All pertinent positives are mentioned above and otherwise negative.   OBJECTIVE: VITAL SIGNS: Blood pressure 126/69, heart rate 80, respirations 16, temp 98.2, bedside pulse ox 91%.  GENERAL: This is a pleasant 81 year old gentleman resting quietly and comfortably in the exam room, in no acute distress. Alert and oriented x 3.  HEAD: Atraumatic, normocephalic.  NECK: Supple. No lymphadenopathy noted.  HEENT: Sclerae anicteric. Mucous membranes moist.  LUNGS: Respirations are even and unlabored, clear to auscultation bilateral anterior lung fields.  HEART: Regular rate and rhythm. S1, S2 noted.  ABDOMEN: Soft, nontender, nondistended. Normoactive bowel sounds noted in all 4 quadrants. No masses palpated. No guarding or rebound.  RECTAL: Exam was deferred as he has recently had a rectal exam and was heme-positive.  EXTREMITIES: Negative for lower extremity edema.  PSYCHIATRIC: Appropriate mood and affect.   LABORATORY DATA: Beadnell blood cells 8.5, hemoglobin 8.7 up from 7.2, hematocrit 24.6, platelets 100, MCV 88. Sodium 135, potassium 3.6, BUN 41, creatinine 1.72, glucose is 97.   ASSESSMENT: 1.  Heme-positive stools.  2.  Normocytic anemia. Etiology at this point is unclear but may be secondary to recent clot retention and hematuria versus anemia of chronic  disease/malignancy versus gastrointestinal blood loss as he was found to be heme-positive.  3.  Metastatic prostate cancer with growth to the  bladder.  4.  Bilateral hydronephrosis.   PLAN: I have discussed this patient's case in detail with Dr. Lutricia Feil, who is involved in the development of the patient's plan of care. At this time, after examining the gastric fluid that was output from the recent NG tube, it does appear dark in color and; therefore, we do recommend checking this for blood as well. The patient has recently been quite constipated and did require administration of an enema and, therefore, the heme-positive stools may likely be secondary to anal trauma versus hemorrhoids. His anemia may likely also be secondary to his gross hematuria/clot retention as well. His hemoglobin has climbed today to 8.7 and, therefore, we do recommend rechecking this in the morning and testing his gastric output for evidence of blood as well. Pending the above, we can certainly consider if the patient would be a candidate for endoscopic intervention at that time. This possibility was discussed in detail with the patient and the patient's family, who verbalized understanding. All questions were answered. We will continue to follow this patient along with you.   Thank you so much for this consultation and for allowing [place] to participate in the patient's plan of care.   The above was discussed and agreed upon under supervisory agreement between myself and Dr. Lutricia Feil.   ATTENDING GASTROENTEROLOGIST:  Dr. Lutricia Feil.   ____________________________ Hardie Shackleton. Kaityln Kallstrom, PA-C kme:cs D: 04/15/2013 15:15:00 ET T: 04/15/2013 15:33:52 ET JOB#: [id number]  cc: Hardie Shackleton. Calistro Rauf, PA-C, <Dictator> Hardie Shackleton Vicki Pasqual PA ELECTRONICALLY SIGNED 04/20/2013 15:53

## 2015-04-15 NOTE — Consult Note (Signed)
PATIENT NAME:  John Espinoza, John Espinoza MR#:  [id number] DATE OF BIRTH:  01-13-1926  DATE OF CONSULTATION:  04/11/2013  REQUESTING PHYSICIAN:  Dr. Assunta Gambles CONSULTING PHYSICIAN:  Susa Griffins, MD  REASON FOR CONSULT:  Syncope.  HISTORY OF PRESENT ILLNESS:  The patient is an 81 year old, pleasant, [place] male with the past medical history of prostate CA, bladder cancer, hypertension, and chronic renal insufficiency, has been having hematuria for the last few days. The patient is under treatment for Zytiga and prednisone at [facility]. The patient developed gross hematuria and a clot retention with new right-sided hydronephrosis for which reason the patient is admitted under Dr. Achilles Dunk. His recent CT scan demonstrated thickening of the bladder base and the trigone region. The patient is currently undergoing continuous bladder irrigation. The patient was admitted on 04/09/2013. The patient was admitted with a hemoglobin of 12.7, trended down to 10.5 on 04/10/2013. Last night, the patient woke up to have a bowel movement. The patient looked extremely lethargic. As the staff made him stand up, he was leaning forward. He sat on the bedside commode and lost consciousness for about 10 to 15 minutes. The patient became pale, could not stand up. Nursing staff put him back to bed. In about 10 to 15 minutes, his orientation completely returned back to baseline. The patient denied having any chest pain, palpitations, looked extremely lethargic, denied having any abdominal pain; however, person has been having nausea, vomiting, for the last two days. The patient was also noted to have worsening of the kidney function from admission of 1.66 to 2.91. The patient continued to have hematuria despite two days of continuous bladder irrigation. Concerning this episode of syncope, Hospitalist service has been consulted for further evaluation.   PAST MEDICAL HISTORY:  1.  Hypertension.  2.  Metastatic prostate CA.  3.   Hydronephrosis.  4.  Chronic renal insufficiency.   PAST SURGICAL HISTORY:  Prostate biopsy.   ALLERGIES:  AGGRENOX.    CURRENT MEDICATIONS: 1.  Vicodin 5/325 mg every 6 hours as needed.  2.  Aminohippuric acid once daily. 3.   for 8 doses. 4.  Amlodipine 5 mg daily.  5.  Ciprofloxacin 500 mg every 12 hours.  6.  Prednisone 5 mg b.i.d.  7.  Zytiga. 8.  Toviaz 4 mg at bedtime.   SOCIAL HISTORY:  No history of alcohol, tobacco, or drug use. Married, lives with his wife.  Family history:  Noncontributory.   REVIEW OF SYSTEMS:  The patient is extremely lethargic and somnolent, unable to provide a review of systems, quickly falling asleep even though arousable, answers simple questions.  CONSTITUTIONAL:  Fatigue, generalized weakness. EYES:  No change in vision.  ENT:  No sore throat. The patient has decreased hearing.  RESPIRATORY:  No cough, shortness of breath.  CARDIOVASCULAR:  No chest pain, palpitations. No pedal edema.  GASTROINTESTINAL:  Nausea, vomiting and constipation.  GENITOURINARY:  Hematuria and history of bladder and prostate CA.  HEMATOLOGIC:  Hematochezia.  SKIN:  No rash or lesions.  MUSCULOSKELETAL:  Even though the patient denies pain, the patient has been moaning.  NEUROLOGIC:  The patient is somewhat somnolent.   PHYSICAL EXAMINATION:   GENERAL:  This is a frail-looking male lying down in the bed, somnolent, lethargic.  VITAL SIGNS:  Temperature 98.2, pulse 99, blood pressure 160/69, respiratory rate of 16, oxygen saturations 96% on 2 liters of oxygen. The patient was initially kept on nonrebreather.  HEENT:  Head normocephalic, atraumatic. No scleral icterus.  Conjunctivae pale. Pupils equal and react to light. Extraocular movements are intact. Mucous membranes:  Mild dryness.  NECK:  Supple. No lymphadenopathy. No JVD. No carotid bruit. No thyromegaly.  CHEST:  No focal tenderness, decreased breath sounds in the lower lobes.  HEART:  S1 and S2, tachycardia.   ABDOMEN:  Bowel sounds present, soft, mild distention. No tenderness. No hepatosplenomegaly.  MUSCULOSKELETAL:  Could not examine as the patient is lethargic and somnolent.  SKIN:  No rash or lesions.  LYMPHATIC:  No axillary or cervical lymphadenopathy as well as in the groin.  NEUROLOGIC:  The patient is quite somnolent, oriented to self and to the situation. Moving all 4 extremities. I could not examine the motor strength as well as sensory.  LABORATORY DATA:  BUN 42, creatinine of 2.99, calcium 7.2, glucose 180. The rest of all the values are within normal limits except for the albumin of 2.5. Cardiac enzymes, troponin less than 0.02. CK-MB 3, magnesium 2.2, phosphorus 4.4. CBC:  WBC of 12.7, hemoglobin 7.6, platelet count of 98, which is a significant drop from the time of the admission of 152.   EKG, 12-lead:  T-wave inversions in the inferior leads. We do not have any old EKG to compare.   ASSESSMENT AND PLAN:  The patient  is an 81 year old male with history of prostate cancer and bladder cancer, chronic kidney disease, admitted to Urology Service for recurrent hematuria felt to be from the bladder cancer. The patient is currently receiving continuous bladder irrigation and had an episode of syncope.  1.  Syncope.  The differential diagnosis is extensive in an 81 year old male.  2.  Orthostatic hypotension. The most likely cause vasovagal, possibly arrhythmias. Considering patient's significant drop of hemoglobin from the time of the admission of 12.6 to 7.2, also could cause acute coronary syndrome. The blood pressure soon after this episode of syncope was 162/84. However, we could not check the orthostatics as the patient was unable to stand up secondary to severe generalized weakness. The patient is currently receiving IV fluids. We will transfuse hemoglobin considering the patient has a significant drop of hemoglobin. We will check the cardiac enzymes x 3.  3.  Nausea, vomiting. We will  obtain KUB to rule out ileus, fecal impaction, or bowel obstruction. This also could be from the narcotic pain medication causing him nausea.  4.  Hypertension, currently well controlled except this reading of a systolic blood pressure of 160. However, the patient was also found to have low systolic blood pressure of less than 100 of one reading; however, most of the readings are systolic blood pressure over the 120s. We will continue to follow up. Continue with amlodipine for now.  5.  Anemia secondary to acute blood loss. A significant drop. We will transfuse 2 units of packed RBC.  6.  Hematuria with a large clot in the bladder. Continue with the bladder irrigation.   7.  Thrombocytopenia. Could be from bleeding. We will continue to follow. If the patient's platelets drop below 50 and continued to have bleeding, may consider giving platelets.  8.  Acute-on-chronic kidney disease secondary to hydronephrosis. Per Dr. Achilles Dunkope plan to do percutaneous nephrostomy if the patient's creatinine does not improve.  9.  Bladder cancer as per Urology.  10.  Severe debility. Will involve the Physical Therapy and Occupational Therapy.   Thank you for your consult.   TIME SPENT:  60 minutes.  ____________________________ Susa GriffinsPadmaja Barrington Worley, MD pv:jm D: 04/11/2013 07:02:15 ET T: 04/11/2013 12:01:16  ET JOB#: [id number]  cc: Susa Griffins, MD, <Dictator> Susa Griffins MD ELECTRONICALLY SIGNED 04/15/2013 8:38

## 2015-04-15 NOTE — Consult Note (Signed)
History of Present Illness:  Reason for Consult Metastatic prostate cancer, gross hematuria with clot   HPI   Patient recently admitted to the hospital with gross hematuria as well as clots.  Currently he is lethargic and much of the history is given by his son. He continues to take Zytiga and prednisone without significant side effects. He had an episode of altered mental status, which was thought to be possibly a new infection. He has a fair appetite and there is no report weight loss.  He did not chest pain or shortness of breath. Patient has otherwise been reported to feel well.  PFSH:  Additional Past Medical and Surgical History Hypertension, diabetes, prostatectomy, orchiectomy.  Family history: Negative and noncontributory.  Social history: Patient denies tobacco or alcohol.   Review of Systems:  Performance Status (ECOG) 3   Review of Systems   As per HPI. Otherwise, 10 point system review was negative.   NURSING NOTES: **Vital Signs.:   21-Apr-14 14:07   Vital Signs Type: Routine   Temperature Temperature (F): 98.3   Celsius: 36.8   Temperature Source: oral   Pulse Pulse: 50   Systolic BP Systolic BP: 761   Diastolic BP (mmHg) Diastolic BP (mmHg): 64   Mean BP: 92   Pulse Ox % Pulse Ox %: 95   Pulse Ox Activity Level: At rest   Oxygen Delivery: 2L   Physical Exam:  Physical Exam General: ill-appearing, no acute distress. Lungs: Clear to auscultation bilaterally. Heart: Regular rate and rhythm. No rubs, murmurs, or gallops. Abdomen: Soft, normoactive bowel sounds. Musculoskeletal: No edema, cyanosis, or clubbing. Neuro: Alert, answering all questions appropriately. Cranial nerves grossly intact. Skin: No rashes or petechiae noted. Psych: lethargic    Aggrenox: GI Distress    Toviaz 4 mg oral tablet, extended release: 1 tab(s) orally once a day, Status: Active, Quantity: 0, Refills: None   predniSONE 5 mg oral tablet: 1 tab(s) orally once a  day, Status: Active, Quantity: 0, Refills: None   Xgeva 120 mg/1.7 mL subcutaneous solution: 1  subcutaneous every 3 months, Status: Active, Quantity: 0, Refills: None   aspirin 325 mg oral tablet: 1 tab(s) orally once a day, Status: Active, Quantity: 0, Refills: None   Vitamin D3 1000 intl units oral tablet: 2 tab(s) orally once a day, Status: Active, Quantity: 0, Refills: None   Fish Oil 1000 mg oral capsule: 1 cap(s) orally once a day, Status: Active, Quantity: 0, Refills: None   calcium (as carbonate) 600 mg oral tablet: 1 tab(s) orally once a day, Status: Active, Quantity: 0, Refills: None   Centrum Silver oral tablet: 1 tab(s) orally once a day, Status: Active, Quantity: 0, Refills: None   Norvasc 5 mg oral tablet: 1 tab(s) orally once a day, Status: Active, Quantity: 0, Refills: None   Zytiga 250 mg oral tablet: 4 tab(s) orally once a day, Status: Active, Quantity: 0, Refills: None  Laboratory Results: Routine Chem:  21-Apr-14 03:59   Glucose, Serum  121  BUN  60  Creatinine (comp)  2.68  Sodium, Serum  130  Potassium, Serum 4.6  Chloride, Serum 100  CO2, Serum 22  Calcium (Total), Serum  7.1  Anion Gap 8  Osmolality (calc) 279  eGFR (African American)  24  eGFR (Non-African American)  21 (eGFR values <7mL/min/1.73 m2 may be an indication of chronic kidney disease (CKD). Calculated eGFR is useful in patients with stable renal function. The eGFR calculation will not be reliable in acutely ill patients  when serum creatinine is changing rapidly. It is not useful in  patients on dialysis. 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 Plan:   1.  Prostate cancer: Okay to continue with daily Zytiga and monthly Xgeva.  Patient most recent bone scan on October 01, 2012 revealed persistent radiotracer accumulation in T11, increased radiotracer and T10, and possible accumulation in the left aspect of the serum also may reflect a small metastatic focus. Patient's next injection of Delton See is scheduled for April 20, 2013.  This likely can be delayed until his acute symptoms resolve. Anemia: Transfuse if hemoglobin falls below 7.0. Thrombocytopenia: Unclear etiology, but likely medication induced.  Will will check DIC labs and HITT for completeness. cosult, will follow.  Electronic Signatures: Delight Hoh (MD)  (Signed 21-Apr-14 16:25)  Authored: HISTORY OF PRESENT ILLNESS, PFSH, ROS, NURSING NOTES, PE, ALLERGIES, HOME MEDICATIONS, LABS, ASSESSMENT AND PLAN   Last Updated: 21-Apr-14 16:25 by Delight Hoh (MD)

## 2015-04-15 NOTE — Consult Note (Signed)
Pt seen and examined. Please see John Espinoza's notes. Pt with heme positive stool though no gross rectal bleeding. Most bleeding from urine. Had radiation therapy to prostate 7-8 yrs ago. Supposedly had colonoscopy in [place] 5-6 yrs ago? NG placed briefly. NG aspirate does look bloody. Will check for blood. Family willing to have patient have EGD if necessary. Will moniter hgb. Will follow.   Electronic Signatures: Lutricia Feilh, Nailani Full (MD)  (Signed on 23-Apr-14 15:03)  Authored  Last Updated: 23-Apr-14 15:03 by Lutricia Feilh, Zahrah Sutherlin (MD)

## 2015-04-15 NOTE — Consult Note (Signed)
Chief Complaint:  Subjective/Chief Complaint Pt tolerating food. Min GI sxs. Foley back in. Blood in foley. Gastrocult positive. Hgb stable.   VITAL SIGNS/ANCILLARY NOTES: **Vital Signs.:   24-Apr-14 15:04  Vital Signs Type Routine  Temperature Temperature (F) 99  Celsius 37.2  Temperature Source oral  Pulse Pulse 71  Respirations Respirations 18  Systolic BP Systolic BP 735  Diastolic BP (mmHg) Diastolic BP (mmHg) 68  Mean BP 87  Pulse Ox % Pulse Ox % 97  Pulse Ox Activity Level  At rest  Oxygen Delivery 2L   Brief Assessment:  Cardiac Regular   Respiratory clear BS   Gastrointestinal Normal   Lab Results: Routine Chem:  24-Apr-14 03:58   Glucose, Serum  122  BUN  29  Creatinine (comp)  1.67  Sodium, Serum 137  Potassium, Serum  3.3  Chloride, Serum 104  CO2, Serum 27  Calcium (Total), Serum  6.9  Anion Gap  6  Osmolality (calc) 281  eGFR (African American)  43  eGFR (Non-African American)  37 (eGFR values <58mL/min/1.73 m2 may be an indication of chronic kidney disease (CKD). Calculated eGFR is useful in patients with stable renal function. The eGFR calculation will not be reliable in acutely ill patients when serum creatinine is changing rapidly. It is not useful in  patients on dialysis. The eGFR calculation may not be applicable to patients at the low and high extremes of body sizes, pregnant women, and vegetarians.)  Result Comment CALCIUM - NOTIFIED OF CRITICAL VALUE  - RESULTS VERIFIED BY REPEAT TESTING.  - CALLED TO DAISY WILKINS:04/16/13@0505   - READ-BACK PROCESS PERFORMED.  Result(s) reported on 16 Apr 2013 at 04:42AM.  Routine Hem:  24-Apr-14 03:58   WBC (CBC)  11.3  RBC (CBC)  2.89  Hemoglobin (CBC)  8.8  Hematocrit (CBC)  25.4  Platelet Count (CBC)  123  MCV 88  MCH 30.5  MCHC 34.7  RDW  15.8  Neutrophil % 85.5  Lymphocyte % 5.3  Monocyte % 7.2  Eosinophil % 1.6  Basophil % 0.4  Neutrophil #  9.6  Lymphocyte #  0.6  Monocyte # 0.8   Eosinophil # 0.2  Basophil # 0.0 (Result(s) reported on 16 Apr 2013 at 04:51AM.)   Assessment/Plan:  Assessment/Plan:  Assessment Anemia. Most bleeding from urine. Min bleeding from GI tract but hgb stable and minimal symptoms.   Plan Daily PPI. Continue on discharge as well. No need for EGD now. Discussed with family. Will sign off. thanks.   Electronic Signatures: Verdie Shire (MD)  (Signed 24-Apr-14 15:46)  Authored: Chief Complaint, VITAL SIGNS/ANCILLARY NOTES, Brief Assessment, Lab Results, Assessment/Plan   Last Updated: 24-Apr-14 15:46 by Verdie Shire (MD)

## 2015-04-15 NOTE — Consult Note (Signed)
S Cr improved' please re-consult as necessary  Electronic Signatures: Mosetta PigeonSingh, Avani Sensabaugh (MD)  (Signed on 25-Apr-14 13:29)  Authored  Last Updated: 25-Apr-14 13:29 by Mosetta PigeonSingh, Nyquan Selbe (MD)

## 2015-04-15 NOTE — Op Note (Signed)
PATIENT NAME:  John Espinoza, John Espinoza MR#:  [id number] DATE OF BIRTH:  01-28-1926  DATE OF PROCEDURE:  11/10/2013  PRINCIPAL DIAGNOSES: Metastatic prostate cancer, bilateral hydronephrosis, chronic renal failure, bladder tumor.   POSTOPERATIVE DIAGNOSES: Metastatic prostate cancer, bilateral hydronephrosis, chronic renal failure, bladder tumor.   PROCEDURE: Cystoscopy, bilateral double-J ureteral stent change, bladder biopsy.   SURGEON: Assunta Gambles, MD   ANESTHESIA: Laryngeal mask airway anesthesia.   INDICATIONS: The patient is an 81 year old gentleman with a long history of metastatic prostate cancer. He has been on hormonal manipulation and subsequently on Zytiga. He has a low PSA level, indicating control of his prostate cancer. He has had long-standing left-sided hydronephrosis with some atrophy of the left kidney. There is new progressive right-sided hydronephrosis. The left ureteral orifice was unable to be identified due to tumor ingrowth at the bladder base. He underwent recent percutaneous nephrostomy tube placement and subsequent stent internalization. He presents for stent change to a metallic Resonance stent as well as metallic stent placed on the right. His recent cystoscopy also demonstrated additional tumors over the bladder base and left lateral wall that have progressed compared to initial evaluation. This is worrisome for a different type of tumor growing in the bladder. He also presents for biopsy.   PROCEDURE: After informed consent was obtained, the patient was taken to the operating room and placed in the dorsal lithotomy position under laryngeal mask airway anesthesia. The patient was then prepped and draped in the usual standard fashion. The 22-French rigid cystoscope was introduced into the urethra under direct vision with no urethral abnormalities noted. Upon entering the prostatic fossa, minimal bilobar hypertrophy was noted with a short prostate fossa. No significant abnormalities  were noted within the prostate. No significant visual obstruction was appreciated. Upon entering the bladder, a large amount of debris was encountered, making visualization difficult. The debris was irrigated free, requiring several rounds of irrigation for better visualization. A stent was noted protruding in between large chunks of tumor extending from the left trigone region. The tissue was very hypervascular and friable. It had a whitish appearance in color. The right ureteral orifice was visualized. It was noted to be relatively open. It is suspected there is significant loss of bladder contractility and subsequent high pressure that is resulting in the right-sided hydronephrosis without definitive obstruction. We, however, have elected to proceed with stent placement due to the degree of tumor ingrowth into the bladder base over fears of impending obstruction. This has previously been discussed with the patient. Multiple areas of yellowish and Kaufmann-appearing tumor were noted along the entire bladder base and left lateral wall. This was noted to have significantly increased in number and extent since his most recent evaluation in the office. A guidewire was advanced into the right ureteral orifice and easily advanced into the upper pole collecting system without difficulty. The Resonance stent sheath was then placed over the guidewire into the renal pelvic region. The Resonance stent was then advanced through the sheath into the renal pelvis. Adequate curl was noted within the renal pelvis under fluoroscopic guidance. Adequate curl was also noted within the urinary bladder upon withdrawal of the sheath. Good positioning overall was noted. Multiple attempts were made at passing the guidewire into the left ureteral orifice. The sheath with the Resonance stent was utilized to help facilitate passage. With multiple attempts, this was successfully navigated into the upper pole collecting system. The scope was  removed, leaving the guidewire in place. The scope was then replaced. Grasping  forceps were utilized to remove the stent. Visualization was undertaken during removal to make sure that there was no displacement of the guidewire. The scope was back-loaded back over the guidewire. The Resonance stent sheath was then placed over the guidewire to the level of the renal pelvis without difficulty. A second Resonance stent was placed through the sheath into the renal pelvis through the sheath without difficulty. Adequate curl was noted under fluoroscopic guidance within the renal pelvic region. Adequate curl was also noted within the urinary bladder upon withdrawal of the sheath. Bilateral 6-French x 24 cm double-J Resonance stents were utilized. Cold cup biopsy forceps were then utilized to obtain biopsies of 2 separate areas of lesion on the bladder base and 1 on the left lateral wall. The tumor was noted to be very friable and hypervascular. The areas were then extensively cauterized utilizing the Bugbee electrodes. No overall significant bleeding was encountered. The bladder was then drained. The cystoscope was removed. A 16-French Foley catheter was then placed into the urinary bladder. 40 mL of 2% lidocaine was instilled into the urinary bladder. The catheter was then removed. The patient was returned to the supine position and awakened from laryngeal mask airway anesthesia. He was taken to the recovery room in stable condition. There were no problems or complications. The patient tolerated the procedure well.   ____________________________ Madolyn FriezeBrian S. Achilles Dunkope, MD bsc:jcm D: 11/10/2013 14:02:59 ET T: 11/10/2013 14:56:40 ET JOB#: [id number]  cc: Madolyn FriezeBrian S. Achilles Dunkope, MD, <Dictator> Madolyn FriezeBRIAN S Elihue Ebert MD ELECTRONICALLY SIGNED 11/11/2013 7:52

## 2015-04-15 NOTE — Consult Note (Signed)
Brief Consult Note: Diagnosis: metastatic prostate CA, gross hematuria, heme+ stool.   Patient was seen by consultant.   Consult note dictated.   Discussed with Attending MD.   Comments: pt seen and examined. consulted for heme+stool. Pt admitted with gross hematuria, metastatic prostate CA to the bladder, s/p irrigation. also with bilateral hydronephrosis- has not required nephrostomy tube. No evidence of gross blood per rectum/ last colonoscopy 5-10 years ago, hx polyps. Toelrating CL diet, no abdominal pain, n/v, heartburn, discomfort. Hx chronic constipation due to meds. ? hemorrhoids. Also gastri fluid appears dark from NGT output. recc testing this fluid for blood. Last transfusion was yesterday, hgb up today at 8.7. Recheck hgb in the am. Pending hgb trend, gastric fluid analysis, will determine if pt would benefit from endo intervention. will follow. full consult dictated.  Electronic Signatures: Ashok CordiaEarle, Silvano Garofano M (PA-C)  (Signed 23-Apr-14 14:51)  Authored: Brief Consult Note   Last Updated: 23-Apr-14 14:51 by Ashok CordiaEarle, Tonimarie Gritz M (PA-C)
